# Patient Record
Sex: Female | Born: 1965 | Race: White | Hispanic: No | Marital: Married
Health system: Southern US, Community
[De-identification: ages and names within clinical notes are randomized; demographics above are authoritative.]

---

## 2003-06-27 ENCOUNTER — Other Ambulatory Visit: Admission: RE | Admit: 2003-06-27 | Discharge: 2003-06-27 | Payer: Self-pay | Admitting: Unknown Physician Specialty

## 2003-11-12 ENCOUNTER — Other Ambulatory Visit: Admission: RE | Admit: 2003-11-12 | Discharge: 2003-11-12 | Payer: Self-pay | Admitting: Obstetrics and Gynecology

## 2006-01-20 ENCOUNTER — Emergency Department (HOSPITAL_COMMUNITY): Admission: EM | Admit: 2006-01-20 | Discharge: 2006-01-20 | Payer: Self-pay | Admitting: Emergency Medicine

## 2006-09-09 ENCOUNTER — Encounter: Admission: RE | Admit: 2006-09-09 | Discharge: 2006-09-09 | Payer: Self-pay | Admitting: Obstetrics and Gynecology

## 2007-03-13 ENCOUNTER — Ambulatory Visit (HOSPITAL_COMMUNITY): Admission: RE | Admit: 2007-03-13 | Discharge: 2007-03-13 | Payer: Self-pay | Admitting: Dermatology

## 2007-05-12 ENCOUNTER — Encounter: Admission: RE | Admit: 2007-05-12 | Discharge: 2007-05-12 | Payer: Self-pay | Admitting: Obstetrics and Gynecology

## 2008-03-15 ENCOUNTER — Encounter: Admission: RE | Admit: 2008-03-15 | Discharge: 2008-03-15 | Payer: Self-pay | Admitting: Obstetrics & Gynecology

## 2008-05-17 IMAGING — CR DG CHEST 2V
2 series · 2 of 2 positions shown · non-contrast
Comparison: none

CLINICAL DATA: Urticaria.  
 CHEST - 2 VIEW:
 No comparison.

[view not recorded (1 of 2)]
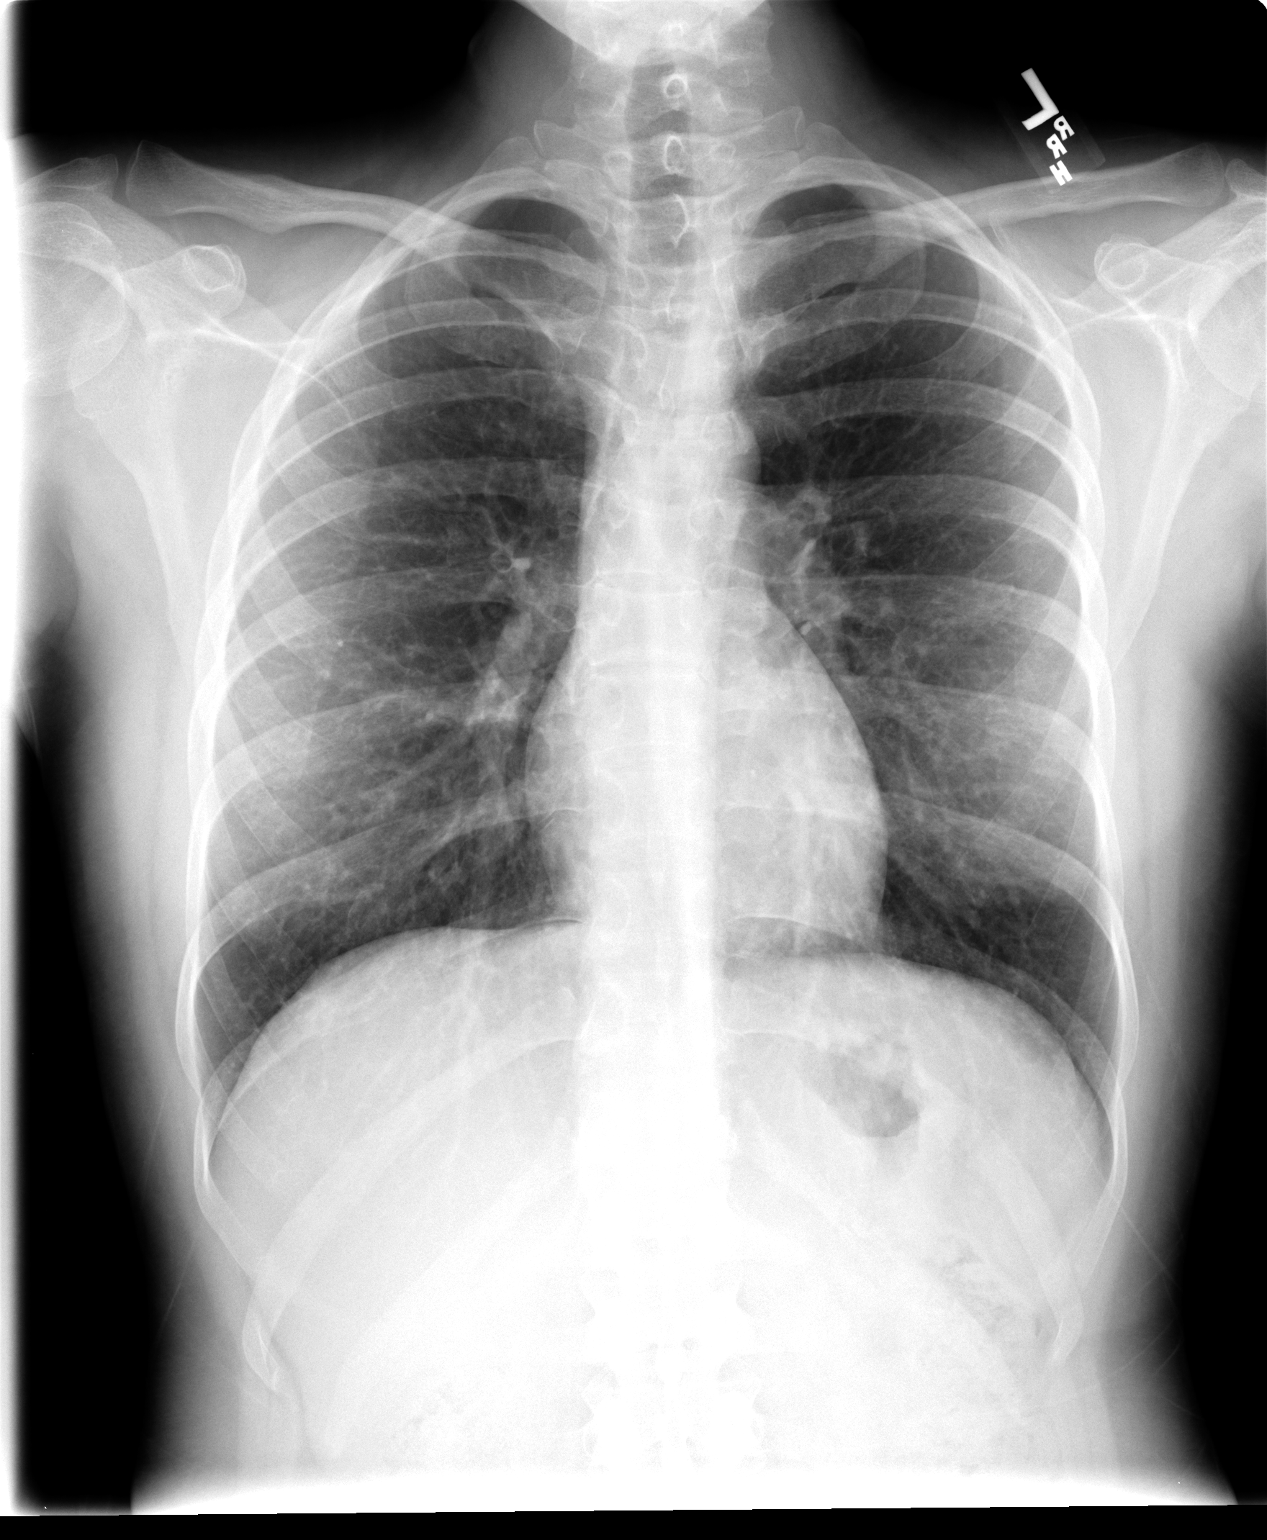

[view not recorded (2 of 2)]
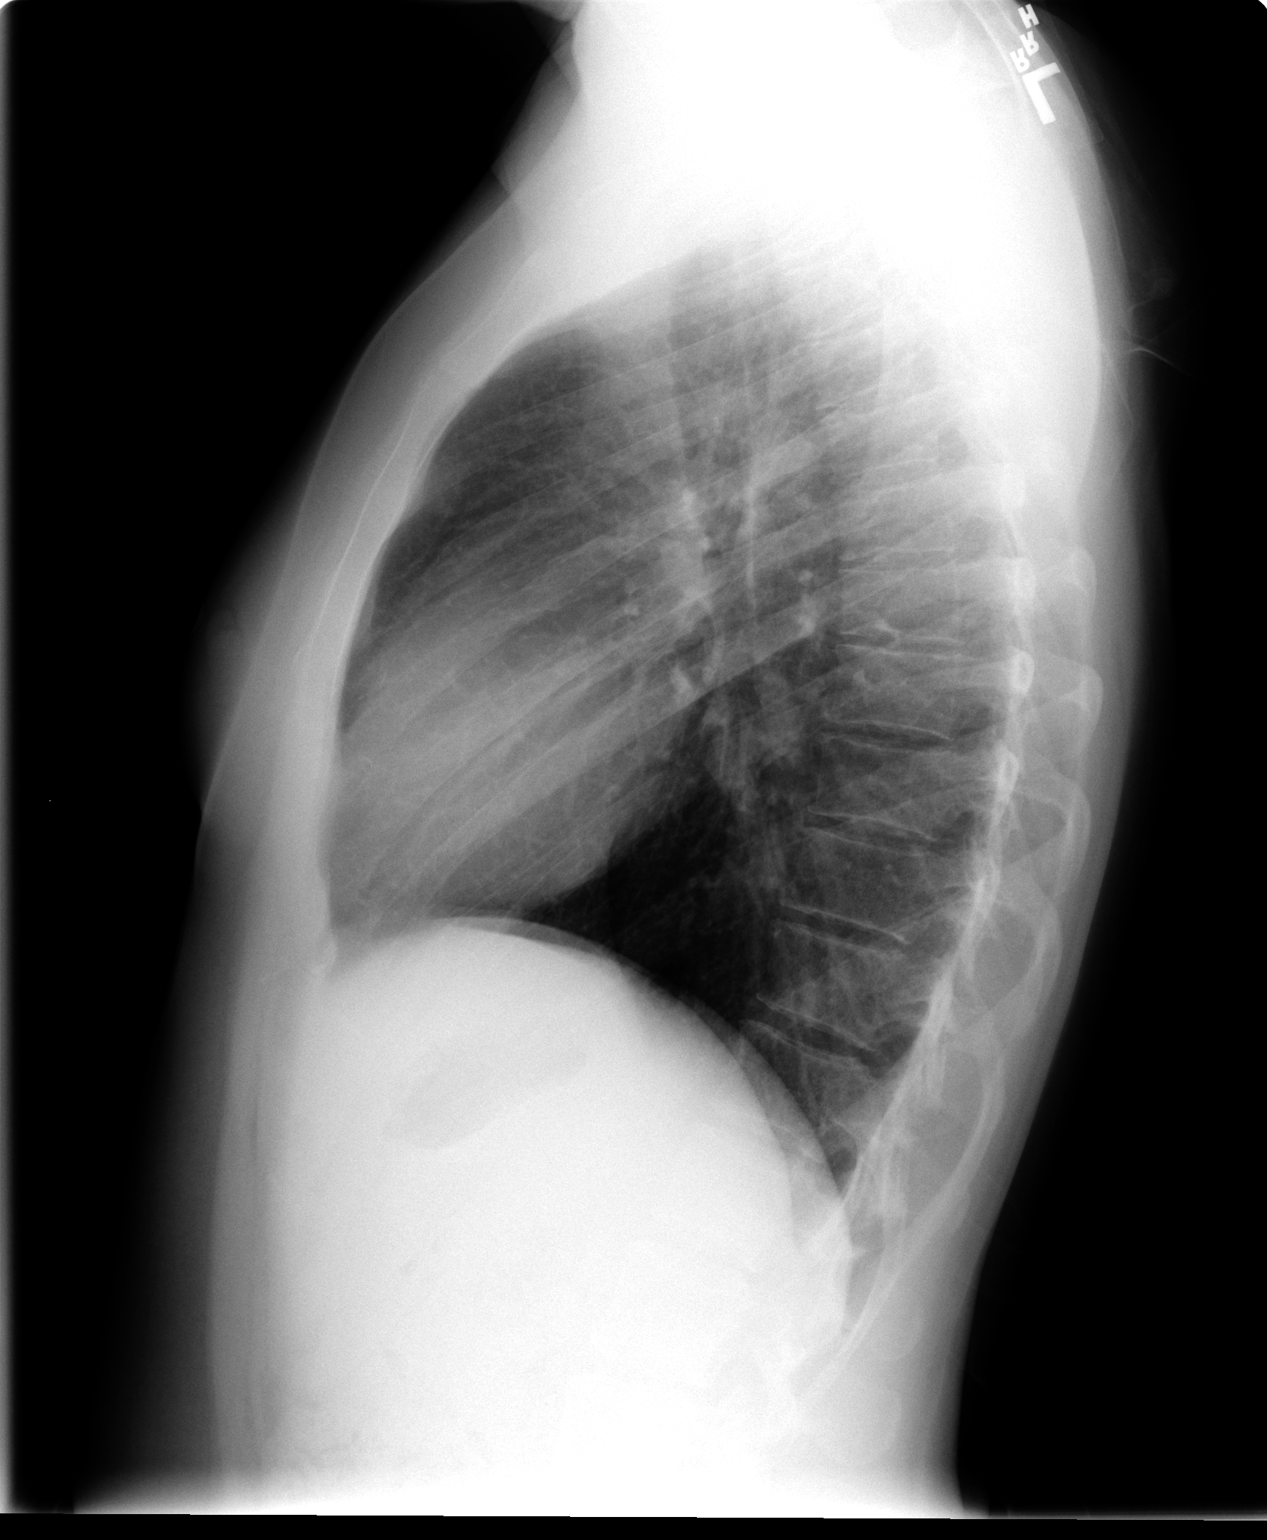

[2 of 2 positions shown; findings below may reference images not displayed]

FINDINGS: The heart size and mediastinal contours are within normal limits.  Both lungs are clear.  The visualized skeletal structures are within normal limits.
IMPRESSION: No active cardiopulmonary disease.

## 2009-06-10 ENCOUNTER — Encounter: Admission: RE | Admit: 2009-06-10 | Discharge: 2009-06-10 | Payer: Self-pay | Admitting: Obstetrics & Gynecology

## 2010-06-23 ENCOUNTER — Ambulatory Visit: Payer: Self-pay | Admitting: Cardiology

## 2010-09-18 ENCOUNTER — Encounter: Admission: RE | Admit: 2010-09-18 | Discharge: 2010-09-18 | Payer: Self-pay | Admitting: Obstetrics and Gynecology

## 2010-12-05 ENCOUNTER — Encounter: Payer: Self-pay | Admitting: Obstetrics and Gynecology

## 2011-01-29 ENCOUNTER — Other Ambulatory Visit: Payer: Self-pay | Admitting: Endocrinology

## 2011-01-29 DIAGNOSIS — E049 Nontoxic goiter, unspecified: Secondary | ICD-10-CM

## 2011-02-01 ENCOUNTER — Ambulatory Visit
Admission: RE | Admit: 2011-02-01 | Discharge: 2011-02-01 | Disposition: A | Discharge status: Home / Self Care | Payer: BC Managed Care – HMO | Source: Ambulatory Visit | Attending: Endocrinology | Admitting: Endocrinology

## 2011-02-01 DIAGNOSIS — E049 Nontoxic goiter, unspecified: Secondary | ICD-10-CM

## 2012-04-07 IMAGING — US US SOFT TISSUE HEAD/NECK
1 series · 14 of 25 positions shown · non-contrast
Comparison: None.

CLINICAL DATA: Goiter, family history of thyroid disease, on
thyroid medication

THYROID ULTRASOUND
TECHNIQUE: Ultrasound examination of the thyroid gland and adjacent
soft tissues was performed.

[Series 1: us soft tissue head/neck · 0.06mm/px · 14 of 32 slices shown]
[im 1/32]
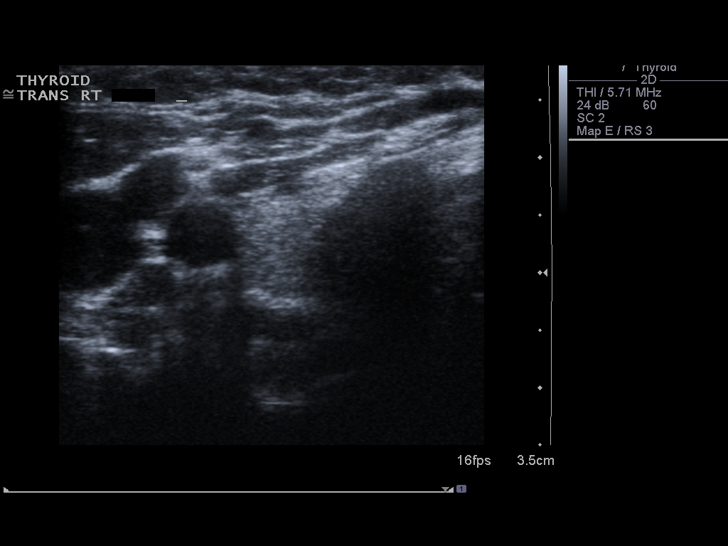
[im 3/32]
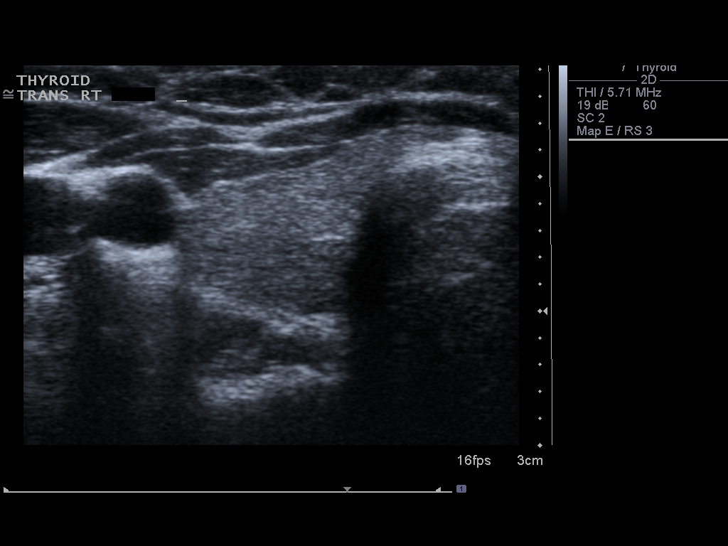
[im 6/32]
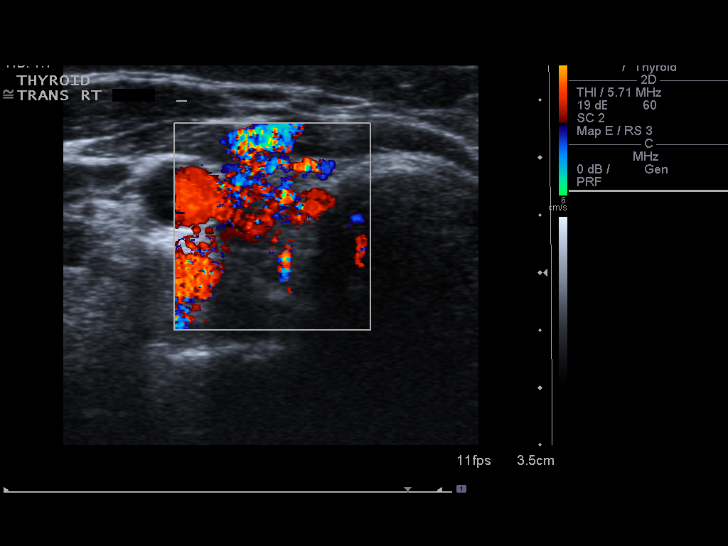
[im 8/32]
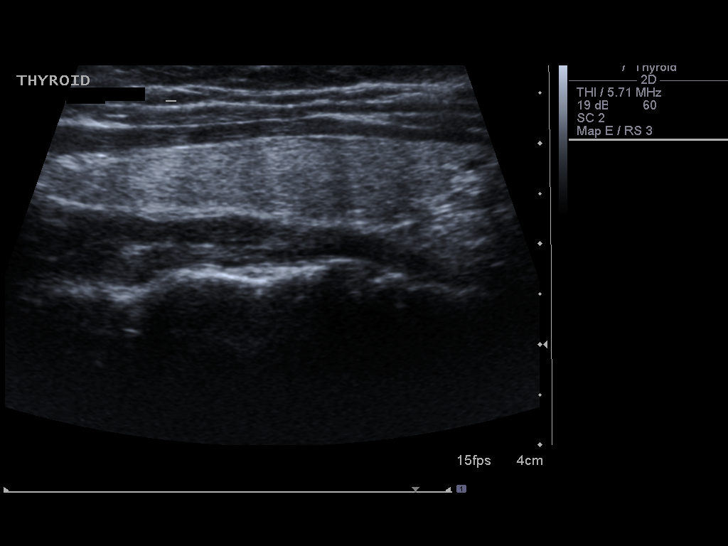
[im 11/32]
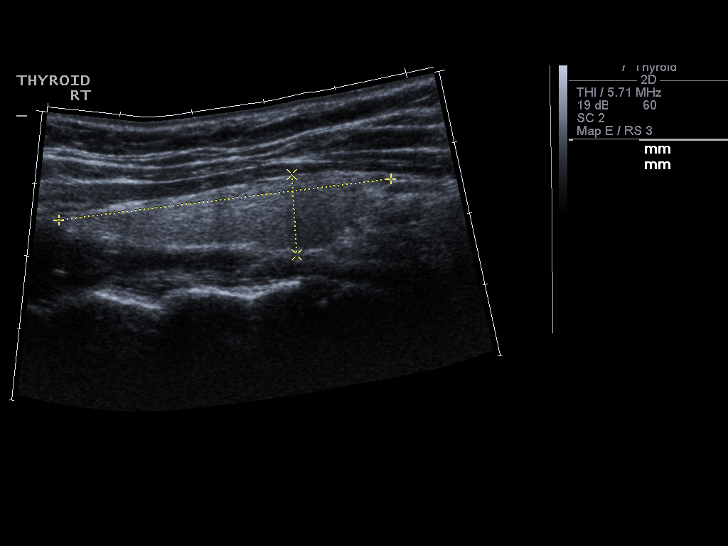
[im 12/32]
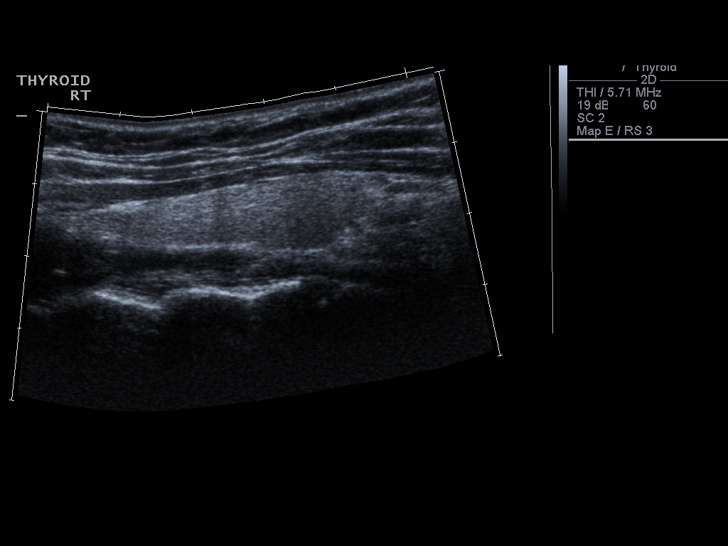
[im 15/32]
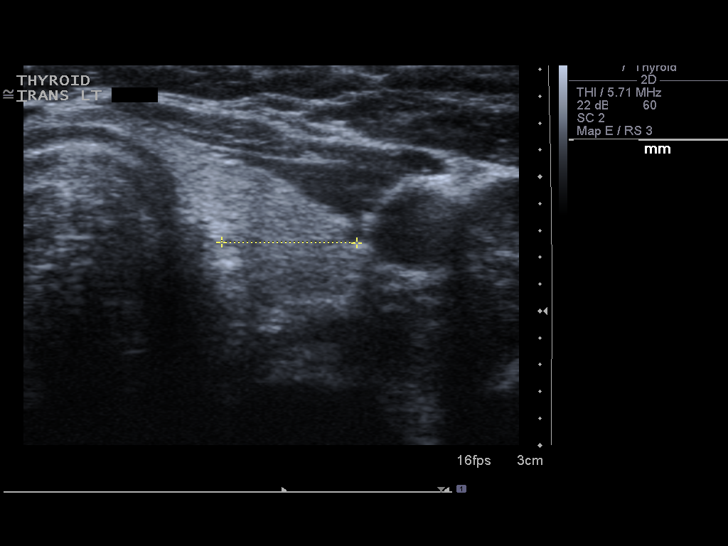
[im 17/32]
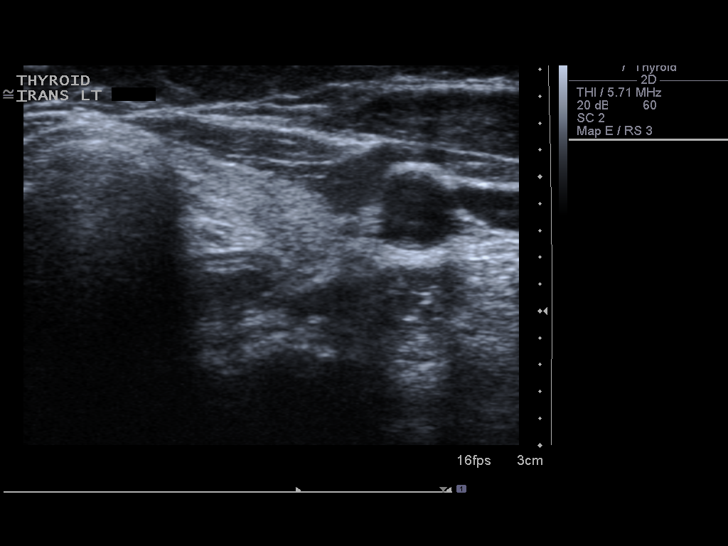
[im 20/32]
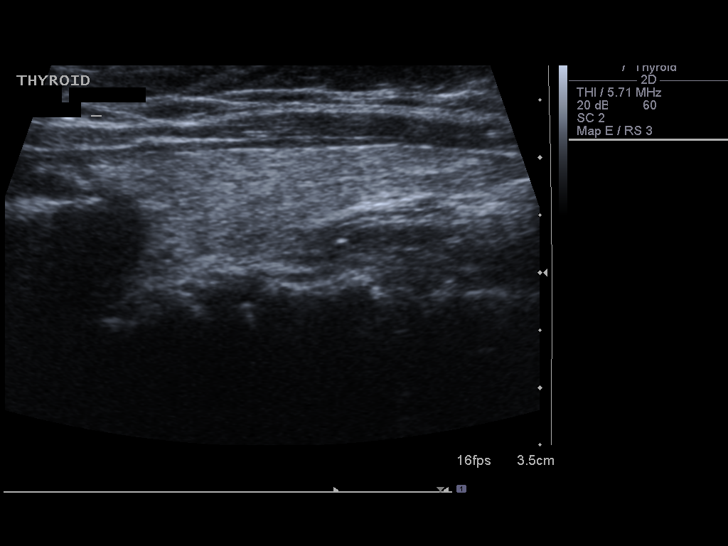
[im 21/32]
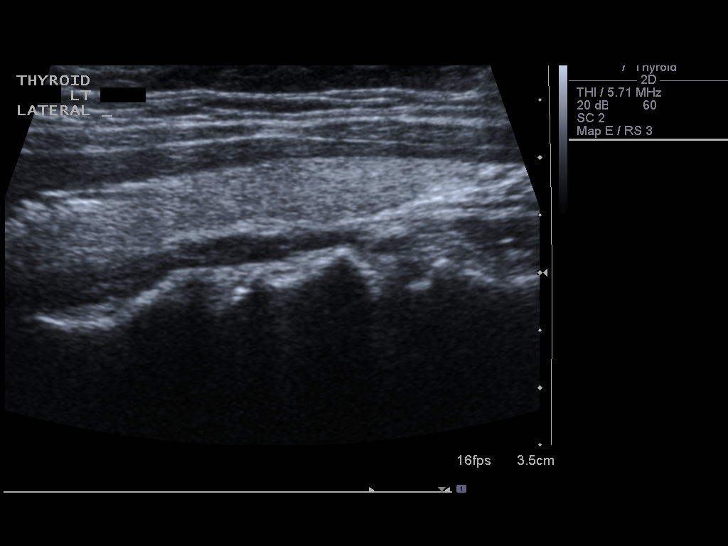
[im 24/32]
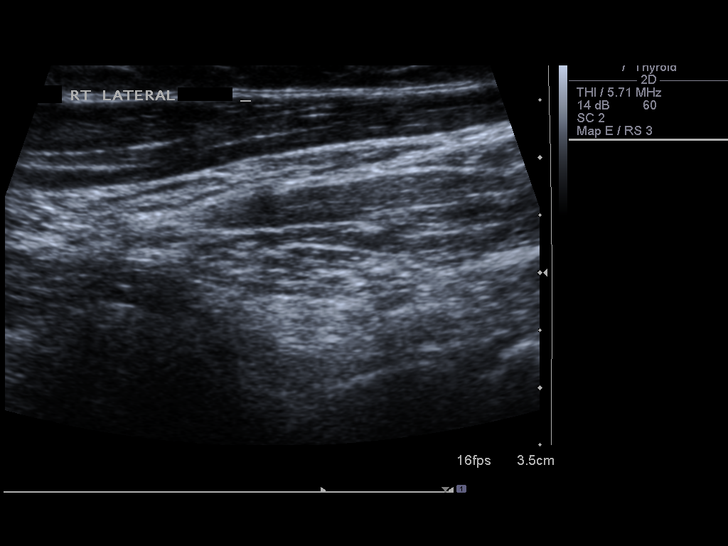
[im 26/32]
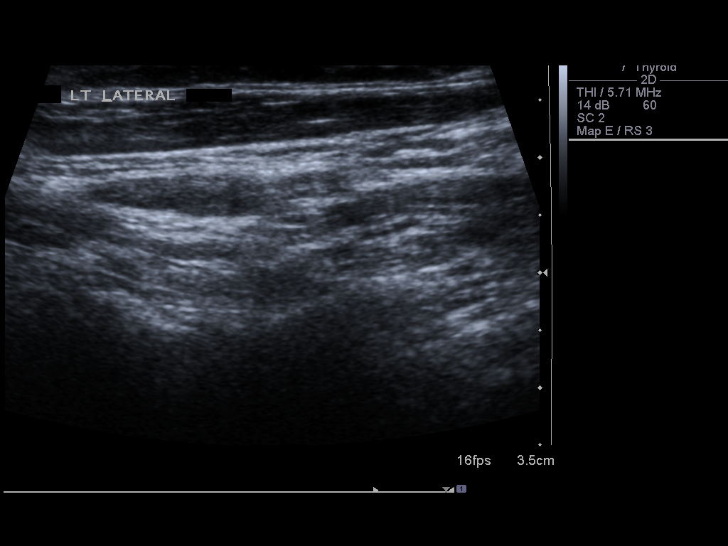
[im 29/32]
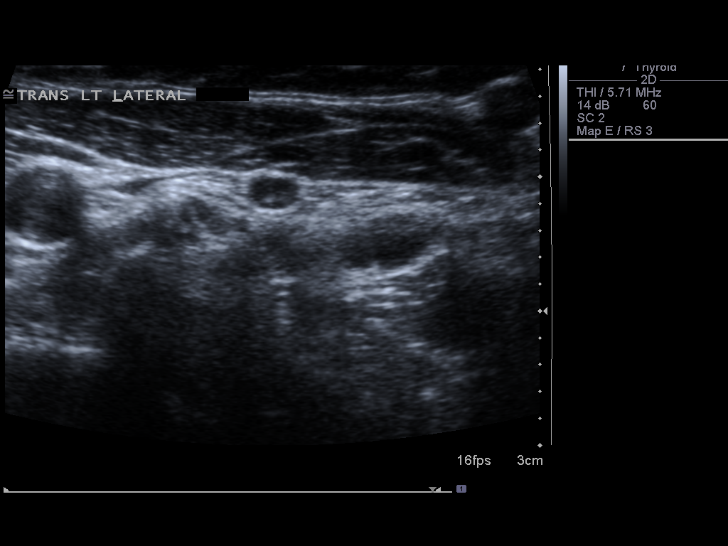
[im 32/32]
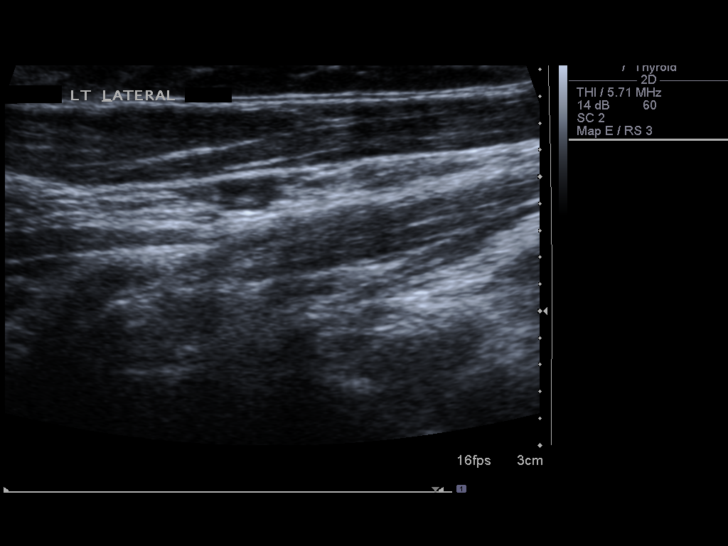

[14 of 25 positions shown; findings below may reference images not displayed]

FINDINGS: Right thyroid lobe:  4.6 x 1.1 x 1.4 cm.
Left thyroid lobe:  4.4 x 1.0 x 1.0 cm.
Isthmus:  1 mm.

Focal nodules:  The gland is relatively homogeneous in
echogenicity.  No solid or cystic thyroid nodule is seen.  Only
small lymph nodes are present bilaterally.

Lymphadenopathy:  None visualized.
IMPRESSION: The thyroid gland is normal in size with no solid or cystic nodule
noted.
